# Patient Record
Sex: Male | Born: 1990 | Hispanic: Yes | Marital: Single | Smoking: Never smoker
Health system: Southern US, Community
[De-identification: ages and names within clinical notes are randomized; demographics above are authoritative.]

## PROBLEM LIST (undated history)

## (undated) HISTORY — PX: TUMOR REMOVAL: SHX12

---
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
## 2010-04-28 ENCOUNTER — Emergency Department: Payer: Self-pay | Admitting: Emergency Medicine

## 2012-12-15 ENCOUNTER — Emergency Department: Payer: Self-pay | Admitting: Emergency Medicine

## 2019-03-04 ENCOUNTER — Emergency Department: Payer: BLUE CROSS/BLUE SHIELD

## 2019-03-04 ENCOUNTER — Other Ambulatory Visit: Payer: Self-pay

## 2019-03-04 ENCOUNTER — Emergency Department
Admission: EM | Admit: 2019-03-04 | Discharge: 2019-03-04 | Disposition: A | Discharge status: Home / Self Care | Payer: BLUE CROSS/BLUE SHIELD | Attending: Emergency Medicine | Admitting: Emergency Medicine

## 2019-03-04 DIAGNOSIS — D352 Benign neoplasm of pituitary gland: Secondary | ICD-10-CM | POA: Insufficient documentation

## 2019-03-04 DIAGNOSIS — R42 Dizziness and giddiness: Secondary | ICD-10-CM | POA: Insufficient documentation

## 2019-03-04 DIAGNOSIS — H538 Other visual disturbances: Secondary | ICD-10-CM | POA: Insufficient documentation

## 2019-03-04 DIAGNOSIS — R51 Headache: Secondary | ICD-10-CM | POA: Diagnosis present

## 2019-03-04 DIAGNOSIS — R252 Cramp and spasm: Secondary | ICD-10-CM | POA: Insufficient documentation

## 2019-03-04 LAB — CBC WITH DIFFERENTIAL/PLATELET
Abs Immature Granulocytes: 0.13 10*3/uL — ABNORMAL HIGH (ref 0.00–0.07)
Basophils Absolute: 0.1 10*3/uL (ref 0.0–0.1)
Basophils Relative: 1 %
Eosinophils Absolute: 0.2 10*3/uL (ref 0.0–0.5)
Eosinophils Relative: 2 %
HCT: 40 % (ref 39.0–52.0)
Hemoglobin: 13.7 g/dL (ref 13.0–17.0)
Immature Granulocytes: 2 %
Lymphocytes Relative: 38 %
Lymphs Abs: 3 10*3/uL (ref 0.7–4.0)
MCH: 30.2 pg (ref 26.0–34.0)
MCHC: 34.3 g/dL (ref 30.0–36.0)
MCV: 88.3 fL (ref 80.0–100.0)
Monocytes Absolute: 0.5 10*3/uL (ref 0.1–1.0)
Monocytes Relative: 6 %
Neutro Abs: 4.2 10*3/uL (ref 1.7–7.7)
Neutrophils Relative %: 51 %
Platelets: 304 10*3/uL (ref 150–400)
RBC: 4.53 MIL/uL (ref 4.22–5.81)
RDW: 13.5 % (ref 11.5–15.5)
WBC: 8 10*3/uL (ref 4.0–10.5)
nRBC: 0 % (ref 0.0–0.2)

## 2019-03-04 LAB — TSH: TSH: 0.849 u[IU]/mL (ref 0.350–4.500)

## 2019-03-04 LAB — PROTIME-INR
INR: 0.8 (ref 0.8–1.2)
Prothrombin Time: 11.1 seconds — ABNORMAL LOW (ref 11.4–15.2)

## 2019-03-04 LAB — BASIC METABOLIC PANEL
Anion gap: 8 (ref 5–15)
BUN: 18 mg/dL (ref 6–20)
CO2: 22 mmol/L (ref 22–32)
Calcium: 9 mg/dL (ref 8.9–10.3)
Chloride: 100 mmol/L (ref 98–111)
Creatinine, Ser: 1.13 mg/dL (ref 0.61–1.24)
GFR calc Af Amer: >60 mL/min (ref >60)
GFR calc non Af Amer: >60 mL/min (ref >60)
Glucose, Bld: 97 mg/dL (ref 70–99)
Potassium: 5.4 mmol/L — ABNORMAL HIGH (ref 3.5–5.1)
Sodium: 130 mmol/L — ABNORMAL LOW (ref 135–145)

## 2019-03-04 LAB — TROPONIN I: Troponin I: <0.03 ng/mL (ref <0.03)

## 2019-03-04 LAB — T4, FREE: Free T4: 0.72 ng/dL — ABNORMAL LOW (ref 0.82–1.77)

## 2019-03-04 MED ORDER — ONDANSETRON 4 MG PO TBDP: 4.0000 mg | ORAL_TABLET | Freq: Three times a day (TID) | ORAL | 0 refills | Status: DC | PRN

## 2019-03-04 MED ORDER — GADOBUTROL 1 MMOL/ML IV SOLN
10.0000 mL | Freq: Once | INTRAVENOUS | Status: AC | PRN
  Administered 2019-03-04: 12:00:00 10 mL via INTRAVENOUS

## 2019-03-04 MED ORDER — IOHEXOL 350 MG/ML SOLN
75.0000 mL | Freq: Once | INTRAVENOUS | Status: AC | PRN
  Administered 2019-03-04: 75 mL via INTRAVENOUS

## 2019-03-04 NOTE — ED Notes (Signed)
Pt sent from [facility] for sudden onset headache about 630am directly after vomiting.  He says  That initially it was throbbing--frontal and top of head and more severe.  He says it is 4/10 now--improved from start.  He says he did have high bp one time when he ate too much pork, but never diagnosed with that.  He is sitting on side of stretcher in nad.

## 2019-03-04 NOTE — ED Notes (Signed)
Patient transported to MRI 

## 2019-03-04 NOTE — ED Notes (Signed)
Blue top tube draw from existing IV

## 2019-03-04 NOTE — Consult Note (Signed)
Neurosurgery-New Consultation Evaluation 03/04/2019 WILBERN PENNYPACKER [id number]  Identifying Statement: Brian Larson is a 28 y.o. male from [place] [address] with headache  Physician Requesting Consultation: [facility]  History of Present Illness: Brian Larson presents with a headache today that has resolved but given the severity, a MRI was obtained that demonstrated a sellar lesion. He states he has had headaches like this before including a couple of months ago. He denies any changes in his vision but does comment on some blurry vision on the left. He denies any weight changes, heat/cold intolerance, sexual dysfunction, changes in body size. He is otherwise healthy.   Past Medical History:  History reviewed. No pertinent past medical history.  Social History: Social History   Socioeconomic History  . Marital status: Single    Spouse name: Not on file  . Number of children: Not on file  . Years of education: Not on file  . Highest education level: Not on file  Occupational History  . Not on file  Social Needs  . Financial resource strain: Not on file  . Food insecurity:    Worry: Not on file    Inability: Not on file  . Transportation needs:    Medical: Not on file    Non-medical: Not on file  Tobacco Use  . Smoking status: Never Smoker  . Smokeless tobacco: Never Used  Substance and Sexual Activity  . Alcohol use: Not Currently  . Drug use: Not on file  . Sexual activity: Not on file  Lifestyle  . Physical activity:    Days per week: Not on file    Minutes per session: Not on file  . Stress: Not on file  Relationships  . Social connections:    Talks on phone: Not on file    Gets together: Not on file    Attends religious service: Not on file    Active member of club or organization: Not on file    Attends meetings of clubs or organizations: Not on file    Relationship status: Not on file  . Intimate partner violence:    Fear of current or ex partner: Not on  file    Emotionally abused: Not on file    Physically abused: Not on file    Forced sexual activity: Not on file  Other Topics Concern  . Not on file  Social History Narrative  . Not on file    Family History: No family history on file.  Review of Systems:  Review of Systems - General ROS: Negative Psychological ROS: Negative Ophthalmic ROS: Negative for vision change ENT ROS: Negative Hematological and Lymphatic ROS: Negative  Endocrine ROS: Negative Respiratory ROS: Negative Cardiovascular ROS: Negative Gastrointestinal ROS: Negative Genito-Urinary ROS: Negative Musculoskeletal ROS: Negative Neurological ROS: Positive for headache Dermatological ROS: Negative  Physical Exam: BP 130/82 (BP Location: Right Arm)   Pulse 70   Temp 98 F (36.7 C) (Oral)   Resp 16   Ht 5\' 6"  (1.676 m)   Wt 100.2 kg   SpO2 99%   BMI 35.67 kg/m  Body mass index is 35.67 kg/m. Body surface area is 2.16 meters squared. General appearance: Alert, cooperative, in no acute distress Head: Normocephalic, atraumatic Eyes: Normal, EOM intact Oropharynx: Wearing a mask Ext: No edema in LE bilaterally  Neurologic exam:  Mental status: alertness: alert, orientation: person, place, time, affect: normal Speech: fluent and clear Cranial nerves:  II: Visual fields are full by confrontation, no ptosis III/IV/VI:  extra-ocular motions intact bilaterally V/VII:no evidence of facial droop or weakness VIII: hearing normal XI: trapezius strength symmetric,  sternocleidomastoid strength symmetric XII: tongue strength symmetric  Motor:strength symmetric 5/5, normal muscle mass and tone in all extremities and no pronator drift Sensory: intact to light touch in all extremities Gait: not tested  Laboratory: Results for orders placed or performed during the hospital encounter of 03/04/19  CBC with Differential  Result Value Ref Range   WBC 8.0 4.0 - 10.5 K/uL   RBC 4.53 4.22 - 5.81 MIL/uL   Hemoglobin  13.7 13.0 - 17.0 g/dL   HCT 40.0 39.0 - 52.0 %   MCV 88.3 80.0 - 100.0 fL   MCH 30.2 26.0 - 34.0 pg   MCHC 34.3 30.0 - 36.0 g/dL   RDW 13.5 11.5 - 15.5 %   Platelets 304 150 - 400 K/uL   nRBC 0.0 0.0 - 0.2 %   Neutrophils Relative % 51 %   Neutro Abs 4.2 1.7 - 7.7 K/uL   Lymphocytes Relative 38 %   Lymphs Abs 3.0 0.7 - 4.0 K/uL   Monocytes Relative 6 %   Monocytes Absolute 0.5 0.1 - 1.0 K/uL   Eosinophils Relative 2 %   Eosinophils Absolute 0.2 0.0 - 0.5 K/uL   Basophils Relative 1 %   Basophils Absolute 0.1 0.0 - 0.1 K/uL   Immature Granulocytes 2 %   Abs Immature Granulocytes 0.13 (H) 0.00 - 0.07 K/uL  Basic metabolic panel  Result Value Ref Range   Sodium 130 (L) 135 - 145 mmol/L   Potassium 5.4 (H) 3.5 - 5.1 mmol/L   Chloride 100 98 - 111 mmol/L   CO2 22 22 - 32 mmol/L   Glucose, Bld 97 70 - 99 mg/dL   BUN 18 6 - 20 mg/dL   Creatinine, Ser 1.13 0.61 - 1.24 mg/dL   Calcium 9.0 8.9 - 10.3 mg/dL   GFR calc non Af Amer >60 >60 mL/min   GFR calc Af Amer >60 >60 mL/min   Anion gap 8 5 - 15  Troponin I - ONCE - STAT  Result Value Ref Range   Troponin I <0.03 <0.03 ng/mL  Protime-INR  Result Value Ref Range   Prothrombin Time 11.1 (L) 11.4 - 15.2 seconds   INR 0.8 0.8 - 1.2  TSH  Result Value Ref Range   TSH 0.849 0.350 - 4.500 uIU/mL  T4, free  Result Value Ref Range   Free T4 0.72 (L) 0.82 - 1.77 ng/dL   I personally reviewed labs  Imaging: Results for orders placed during the hospital encounter of 03/04/19  MR BRAIN W WO CONTRAST   Narrative CLINICAL DATA:  28 year old male with acute severe headache and possible enlargement/hyperdensity of the pituitary gland on plain head CT today.  EXAM: MRI HEAD WITHOUT AND WITH CONTRAST  TECHNIQUE: Multiplanar, multiecho pulse sequences of the brain and surrounding structures were obtained without and with intravenous contrast.  CONTRAST:  10 milliliters Gadavist  COMPARISON:  Head CT without contrast 1003  hours.  FINDINGS: Brain: Pituitary findings are below.  Superimposed normal cerebral volume. No restricted diffusion to suggest acute infarction. No midline shift, ventriculomegaly, extra-axial collection or acute intracranial hemorrhage. Cervicomedullary junction within normal limits. Pearline Cables and white matter signal is within normal limits throughout the brain. No cortical encephalomalacia or chronic cerebral blood products identified. Excluding the pituitary region, no abnormal intracranial enhancement. No dural thickening.  Vascular: Major intracranial vascular flow voids are preserved.  Skull and upper  cervical spine: Skull bone marrow signal is normal. The clivus is intact. There is mildly heterogeneous bone marrow signal in the C4 vertebral body which is probably benign (series 5, image 13). Otherwise negative visible cervical spine.  Sinuses/Orbits: Negative orbits. Paranasal sinuses are clear. Mastoid air cells are clear. Visible internal auditory structures appear normal. Scalp and face soft tissues appear negative.  Other: Dedicated pituitary imaging without and with contrast. The pituitary gland appears mildly enlarged although the suprasellar cistern remains normal. Thin slice pre and postcontrast images of the gland demonstrate an oval mildly lobulated space-occupying mass in the posterior aspect of the gland and sella encompassing 9 x 7 x 12-13 millimeters (AP by transverse by CC) this has intermediate T1 signal and does not enhance on dynamic postcontrast images. This area has dark T2 signal (series 8, image 9), dark FLAIR signal, and also appears decreased on T2 * (series 9, image 9).  This lesion appears to be displacing the more homogeneously enhancing gland anterior (series 20, image 7) along with anterior displacement of the infundibulum. The infundibulum has normal size and enhancement. The hypothalamus appears normal. The cavernous sinuses appear  normal.  IMPRESSION: 1. Positive for a 9 x 12 mm posteriorly situated intra-sellar circumscribed space-occupying mass with internal signal characteristics suspicious for hemorrhage. 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 There is associated anterior mass effect on the gland and infundibulum. The cavernous sinus and suprasellar cistern remain normal. Recommend endocrine function testing and referral to Endocrinology and/or Neurosurgery for further evaluation and potential treatment planning. 2. Otherwise normal MRI appearance of the brain.   Electronically Signed   By: Genevie Ann M.D.   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 We have also performed a CTA to rule out any aneurysm. He has no symptoms concerning for hormone dysfunction but I do recommend checking prolactin, TSH/T4, IGF-1. He will need endocrine follow up to complete work-up. He will also need full visual field testing with ophthalmology. I will set follow up with me in clinic to discuss further. There is no indication for urgent intervention given small nature of mass and no visual field symptoms.    1.  Diagnosis: Pituitary mass  2.  Plan - Follow up in clinic Referral to endocrinology and ophthalmology

## 2019-03-04 NOTE — ED Notes (Signed)
Patient transported to CT 

## 2019-03-04 NOTE — ED Notes (Signed)
Awaiting MRI

## 2019-03-04 NOTE — ED Notes (Signed)
Returned from ct 

## 2019-03-04 NOTE — ED Notes (Signed)
Vital signs stable. 

## 2019-03-04 NOTE — ED Notes (Signed)
Orthostatics  Lying hr 76 bp 133/99 sititng  Hr 71  bp 137/111 Standing hr 82 bp 127/111

## 2019-03-04 NOTE — ED Triage Notes (Signed)
Pt sent from [facility] with c/o HA N/V with dizziness that started this morning. States he felt fine yesterday.

## 2019-03-04 NOTE — ED Provider Notes (Signed)
[facility] Emergency Department Provider Note  ____________________________________________   First MD Initiated Contact with Patient 03/04/19 [phone]     (approximate)  I have reviewed the triage vital signs and the nursing notes.   HISTORY  Chief Complaint Emesis and Headache    HPI Brian Larson is a 28 y.o. male with headache and dizziness.  The patient states that his symptoms started around 6:30 AM this morning.  He states that he went to work at 6 AM was feeling fine.  He does admit he is been under significantly increased stress and was working physically all day yesterday with little to eat or drink.  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  He began to stand up and began to feel lightheaded and dizzy.  He then experienced acute onset of a mild and gradually progressively worse headache.  The headache is now improved.  He had associated cramping sensation in his hands and a sensation that he was going to pass out.  He felt like his vision was "blurry."  Denies any focal numbness or weakness.  No history of previous frequent headaches.  No recent head trauma.  He now feels tired but otherwise is without complaints.  Has had no fevers or chills.  No recent sick contacts.  Symptoms seem to be worse when he stands up.  No alleviating factors.  He went to [facility] urgent care prior to arrival was sent here for further evaluation.  No family history of subarachnoid's or aneurysms.          History reviewed. No pertinent past medical history.  There are no active problems to display for this patient.   History reviewed. No pertinent surgical history.  Prior to Admission medications   Medication Sig Start Date End Date Taking? Authorizing Provider  ondansetron (ZOFRAN ODT) 4 MG disintegrating tablet Take 1 tablet (4 mg total) by mouth every 8 (eight) hours as needed for nausea or vomiting. 03/04/19   Duffy Bruce, MD    Allergies Patient has  no known allergies.  No family history on file.  Social History Social History   Tobacco Use   Smoking status: Never Smoker   Smokeless tobacco: Never Used  Substance Use Topics   Alcohol use: Not Currently   Drug use: Not on file    Review of Systems Constitutional: No fever/chills Eyes: Slight subjective blurred vision as above, resolved.   ENT: No sore throat. Cardiovascular: Denies chest pain. Respiratory: Denies shortness of breath. Gastrointestinal: No abdominal pain.  Mild nausea, no vomiting.   No diarrhea.  No constipation. Genitourinary: Negative for dysuria. Musculoskeletal: Negative for neck pain.  Negative for back pain. Integumentary: Negative for rash. Neurological: Mild headache, dizziness as mentioned now resolved.  No tinnitus.  No focal numbness or weakness.  No tremors.    ____________________________________________   PHYSICAL EXAM:  VITAL SIGNS: ED Triage Vitals  Enc Vitals Group     BP 03/04/19 0912 (!) 135/96     Pulse Rate 03/04/19 0912 73     Resp 03/04/19 0912 17     Temp 03/04/19 0912 98.1 F (36.7 C)     Temp Source 03/04/19 0912 Oral     SpO2 03/04/19 0912 98 %     Weight 03/04/19 0913 221 lb (100.2 kg)     Height 03/04/19 0913 5\' 6"  (1.676 m)     Head Circumference --      Peak Flow --  Pain Score 03/04/19 0913 4     Pain Loc --      Pain Edu? --      Excl. in [place]? --    Constitutional: Alert and oriented. Well appearing and in no acute distress. Eyes: Conjunctivae are normal. PERRL. EOMI. Head: Atraumatic. Nose: No congestion/rhinnorhea. Mouth/Throat: Dry MM. OP clear. Neck: No stridor.  No meningeal signs.   Cardiovascular: Normal rate, regular rhythm. Good peripheral circulation. Grossly normal heart sounds. Respiratory: Normal respiratory effort.  No retractions. No audible wheezing. Gastrointestinal: Soft and nontender. No distention.  Neurologic:   Neurological Exam:  Mental Status: Alert and oriented to person,  place, and time. Attention and concentration normal. Speech clear. Recent memory is intact. Cranial Nerves: Visual fields grossly intact. EOMI and PERRLA. No nystagmus noted. Facial sensation intact at forehead, maxillary cheek, and chin/mandible bilaterally. No facial asymmetry or weakness. Hearing grossly normal. Uvula is midline, and palate elevates symmetrically. Normal SCM and trapezius strength. Tongue midline without fasciculations. Motor: Muscle strength 5/5 in proximal and distal UE and LE bilaterally. No pronator drift. Muscle tone normal. Reflexes: 2+ and symmetrical in all four extremities.  Sensation: Intact to light touch in upper and lower extremities distally bilaterally.  Gait: Normal without ataxia. Coordination: Normal FTN bilaterally. Skin:  Skin is warm, dry and intact. No rash noted.   ____________________________________________   LABS (all labs ordered are listed, but only abnormal results are displayed)  Labs Reviewed  CBC WITH DIFFERENTIAL/PLATELET - Abnormal; Notable for the following components:      Result Value   Abs Immature Granulocytes 0.13 (*)    All other components within normal limits  BASIC METABOLIC PANEL - Abnormal; Notable for the following components:   Sodium 130 (*)    Potassium 5.4 (*)    All other components within normal limits  PROTIME-INR - Abnormal; Notable for the following components:   Prothrombin Time 11.1 (*)    All other components within normal limits  T4, FREE - Abnormal; Notable for the following components:   Free T4 0.72 (*)    All other components within normal limits  TROPONIN I  TSH  PROLACTIN  INSULIN-LIKE GROWTH FACTOR   ____________________________________________  EKG  Normal sinus rhythm, ventricular rate 63.  PR 175, QRS 95, QTc 406.  Normal sinus rhythm.  No apparent acute ischemic changes. ____________________________________________  RADIOLOGY   ED MD interpretation: CT Head: Question of hyperdense  pituitary lesion. MRI as per below, concerning for likely pituitary adenoma vs cyst. CT Angio: No apparent large aneurysm noted on my review, no active bleeding.  Official radiology report(s): Ct Angio Head W Or Wo Contrast  Result Date: 03/04/2019 CLINICAL DATA:  Global headaches since 6 a.m. with dizziness. EXAM: CT ANGIOGRAPHY HEAD TECHNIQUE: Multidetector CT imaging of the head was performed using the standard protocol during bolus administration of intravenous contrast. Multiplanar CT image reconstructions and MIPs were obtained to evaluate the vascular anatomy. CONTRAST:  39mL OMNIPAQUE IOHEXOL 350 MG/ML SOLN COMPARISON:  None. FINDINGS: CTA HEAD Anterior circulation: No significant stenosis, proximal occlusion, aneurysm, or vascular malformation. Posterior circulation: No significant stenosis, proximal occlusion, aneurysm, or vascular malformation. Venous sinuses: As permitted by contrast timing, patent. Anatomic variants: None of significance Delayed phase: Not performed. IMPRESSION: Normal intracranial arterial vascular anatomy. There is no evidence for saccular aneurysm. No arterial feeder or extravasation is associated with the intra sellar hyperintense lesion. Differential considerations include pituitary apoplexy versus Rathke's cleft cyst. Neurosurgical consultation is warranted. Electronically Signed  By: Staci Righter M.D.   On: 03/04/2019 13:47   Ct Head Wo Contrast  Result Date: 03/04/2019 CLINICAL DATA:  Headache.  Worst headache of life. EXAM: CT HEAD WITHOUT CONTRAST TECHNIQUE: Contiguous axial images were obtained from the base of the skull through the vertex without intravenous contrast. COMPARISON:  None FINDINGS: Brain: There is increased density within the prominent pituitary gland which measures 83.6 Hounsfield units compared with 45 HU of normal brain parenchyma. The gland itself has a height of 8 mm which is within normal limits. The cerebral and cerebellar hemispheres appear  unremarkable. No evidence for acute infarct, hydrocephalus, or cerebral or cerebellar hemorrhage. Vascular: No hyperdense vessel or unexpected calcification. Skull: Normal. Negative for fracture or focal lesion. Sinuses/Orbits: None Other: None IMPRESSION: 1. There is increased density within the normal size pituitary gland measuring 83.6 HU, greater than normal brain parenchyma. Etiology indeterminate. In the acute setting, a small hemorrhagic pituitary adenoma cannot be excluded. Recommend further evaluation with contrast enhanced pituitary gland MRI. 2. Remainder of the gland is negative. Electronically Signed   By: Kerby Moors M.D.   On: 03/04/2019 10:20   Mr Jeri Cos DX Contrast  Result Date: 03/04/2019 CLINICAL DATA:  28 year old male with acute severe headache and possible enlargement/hyperdensity of the pituitary gland on plain head CT today. EXAM: MRI HEAD WITHOUT AND WITH CONTRAST TECHNIQUE: Multiplanar, multiecho pulse sequences of the brain and surrounding structures were obtained without and with intravenous contrast. CONTRAST:  10 milliliters Gadavist COMPARISON:  Head CT without contrast 1003 hours. FINDINGS: Brain: Pituitary findings are below. Superimposed normal cerebral volume. No restricted diffusion to suggest acute infarction. No midline shift, ventriculomegaly, extra-axial collection or acute intracranial hemorrhage. Cervicomedullary junction within normal limits. Pearline Cables and white matter signal is within normal limits throughout the brain. No cortical encephalomalacia or chronic cerebral blood products identified. Excluding the pituitary region, no abnormal intracranial enhancement. No dural thickening. Vascular: Major intracranial vascular flow voids are preserved. Skull and upper cervical spine: Skull bone marrow signal is normal. The clivus is intact. There is mildly heterogeneous bone marrow signal in the C4 vertebral body which is probably benign (series 5, image 13). Otherwise  negative visible cervical spine. Sinuses/Orbits: Negative orbits. Paranasal sinuses are clear. Mastoid air cells are clear. Visible internal auditory structures appear normal. Scalp and face soft tissues appear negative. Other: Dedicated pituitary imaging without and with contrast. The pituitary gland appears mildly enlarged although the suprasellar cistern remains normal. Thin slice pre and postcontrast images of the gland demonstrate an oval mildly lobulated space-occupying mass in the posterior aspect of the gland and sella encompassing 9 x 7 x 12-13 millimeters (AP by transverse by CC) this has intermediate T1 signal and does not enhance on dynamic postcontrast images. This area has dark T2 signal (series 8, image 9), dark FLAIR signal, and also appears decreased on T2 * (series 9, image 9). This lesion appears to be displacing the more homogeneously enhancing gland anterior (series 20, image 7) along with anterior displacement of the infundibulum. The infundibulum has normal size and enhancement. The hypothalamus appears normal. The cavernous sinuses appear normal. IMPRESSION: 1. Positive for a 9 x 12 mm posteriorly situated intra-sellar circumscribed space-occupying mass with internal signal characteristics suspicious for hemorrhage. 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 There is associated anterior mass effect on the gland and infundibulum. The cavernous sinus and suprasellar cistern remain normal. Recommend endocrine function testing and referral to Endocrinology and/or Neurosurgery for further evaluation and  potential treatment planning. 2. Otherwise normal MRI appearance of the brain. Electronically Signed   By: Genevie Ann M.D.   On: 03/04/2019 12:36    ____________________________________________   PROCEDURES   Procedure(s) performed (including Critical Care):  Procedures   ____________________________________________   INITIAL IMPRESSION / MDM / [place] / ED  COURSE  As part of my medical decision making, I reviewed the following data within the [place] notes reviewed and incorporated, Notes from prior ED visits and [place] Controlled Substance [employer] was evaluated in Emergency Department on 03/04/2019 for the symptoms described in the history of present illness. He was evaluated in the context of the global COVID-19 pandemic, which necessitated consideration that the patient might be at risk for infection with the SARS-CoV-2 virus that causes COVID-19. Institutional protocols and algorithms that pertain to the evaluation of patients at risk for COVID-19 are in a state of rapid change based on information released by regulatory bodies including the CDC and federal and state organizations. These policies and algorithms were followed during the patient's care in the ED.  Some ED evaluations and interventions may be delayed as a result of limited staffing during the pandemic.*   Clinical Course as of Mar 03 1837  Tue Mar 04, 2019  0947 28 yo M here with mild HA, dizziness, weakness. Sx sound most consistent with transient vagal response versus orthostasis. However, HA is new and not usual so will check CT Head. He is <6 hours prior to onset and I have a low pre-test probability of SAH. No focal neuro deficits. He is mildly hypertensive here with could also be contributing. Will f/u labs, orthostatics, and re-assess.    [CI]  1001 Orthostatics wnl. Mild HTN actually noted upon standing.   [CI]  0981 CT scan reviewed by myself, c/f pituitary adenoma. I subsequently obtain MRI and see a pituitary adenoma with small microhemorrhage.  Will consult neurosurgery.   [CI]    Clinical Course User Index [CI] Duffy Bruce, MD    Case as above. 28 yo M here with HA. Imaging concerning for pituitary adenoma with microhemorrhage, not actively bleeding. Dr. Lacinda Axon has seen at bedside. Will send endocrine labs and refer for  close otupt follow-up. Neuro intact. HA resolved. VSS.   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   Duffy Bruce, MD 03/04/19 [phone]

## 2019-03-04 NOTE — ED Notes (Signed)
MD at bedside. 

## 2019-03-05 LAB — INSULIN-LIKE GROWTH FACTOR: Somatomedin C: 142 ng/mL (ref 98–282)

## 2019-03-05 LAB — PROLACTIN: Prolactin: 10.5 ng/mL (ref 4.0–15.2)

## 2019-03-13 ENCOUNTER — Other Ambulatory Visit: Payer: Self-pay | Admitting: Neurosurgery

## 2019-03-13 DIAGNOSIS — D352 Benign neoplasm of pituitary gland: Secondary | ICD-10-CM

## 2019-03-13 DIAGNOSIS — R519 Headache, unspecified: Secondary | ICD-10-CM

## 2019-03-14 ENCOUNTER — Ambulatory Visit: Payer: BC Managed Care – PPO | Attending: Neurosurgery

## 2020-02-22 IMAGING — CT CT ANGIOGRAPHY HEAD
3 of 7 series · 16 of 47 positions shown · IV contrast (APPLIED)
Comparison: None.

CLINICAL DATA: Global headaches since 6 a.m. with dizziness.

EXAM:
CT ANGIOGRAPHY HEAD
TECHNIQUE: Multidetector CT imaging of the head was performed using the
standard protocol during bolus administration of intravenous
contrast. Multiplanar CT image reconstructions and MIPs were
obtained to evaluate the vascular anatomy.
CONTRAST:  75mL OMNIPAQUE IOHEXOL 350 MG/ML SOLN

[Series 7: ax thin · axial · 0.37mm/px · z∈[-187,-46]mm · 10 of 163 slices shown]
[im 10/163  brain]
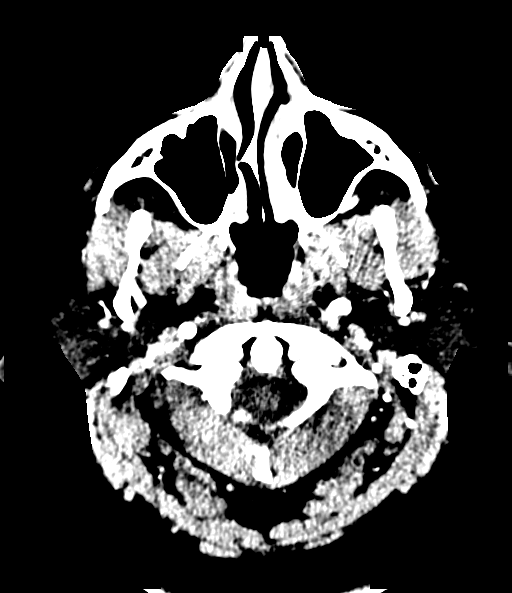
[im 29/163  bone]
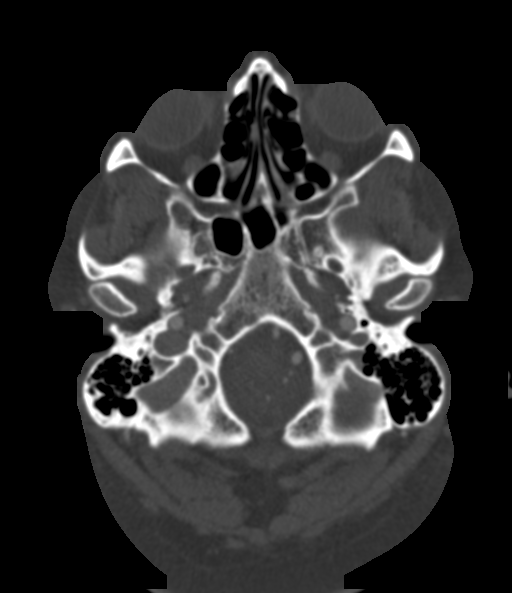
[im 48/163  brain]
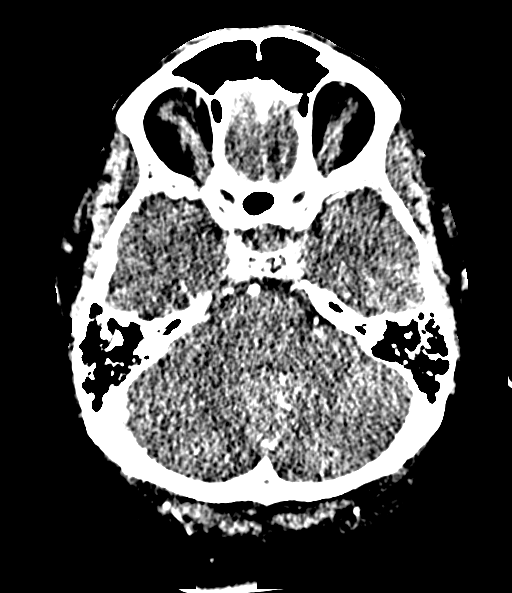
[im 58/163  bone]
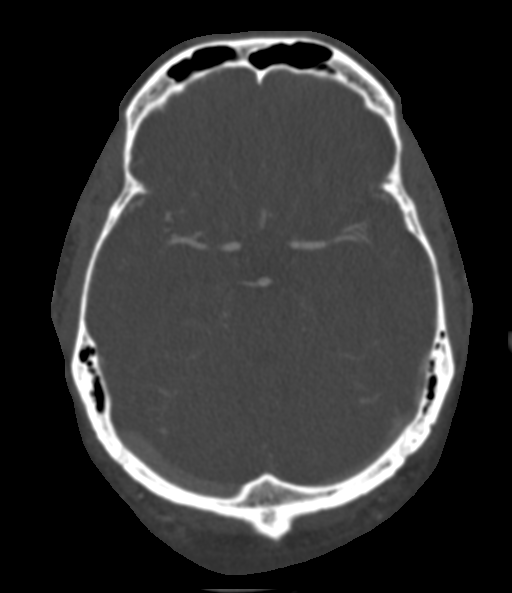
[im 77/163  brain]
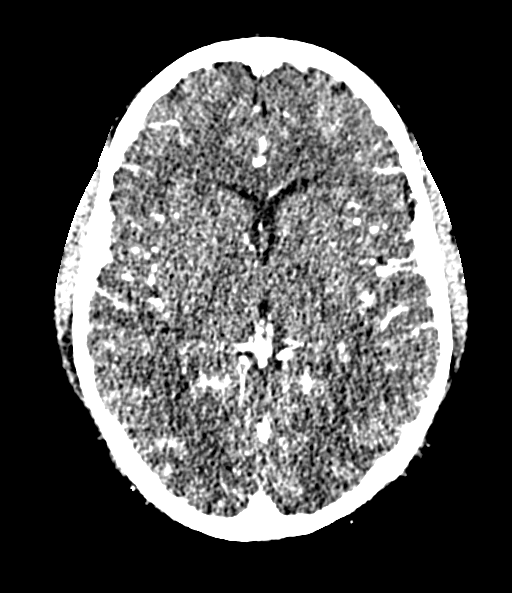
[im 86/163  bone]
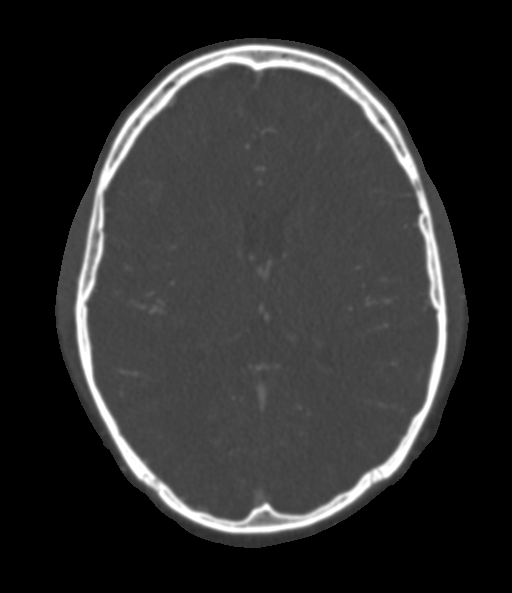
[im 105/163  brain]
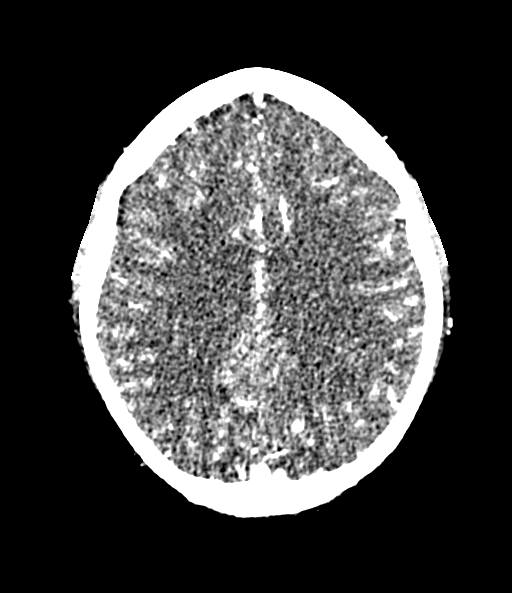
[im 124/163  bone]
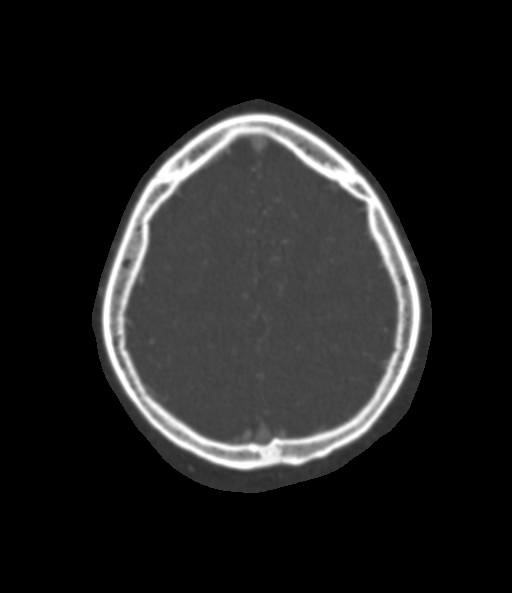
[im 134/163  brain]
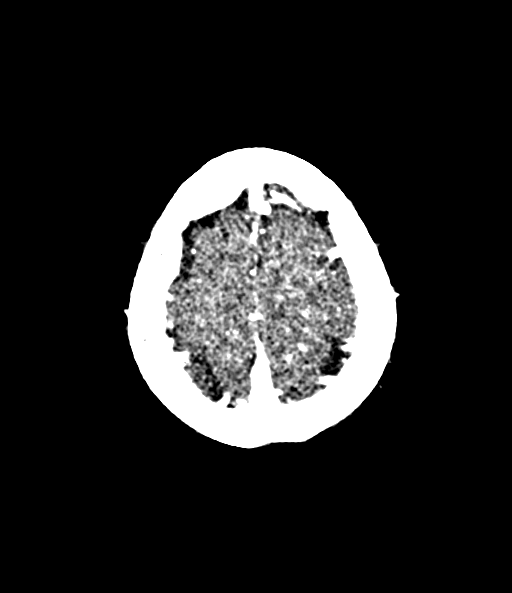
[im 153/163  bone]
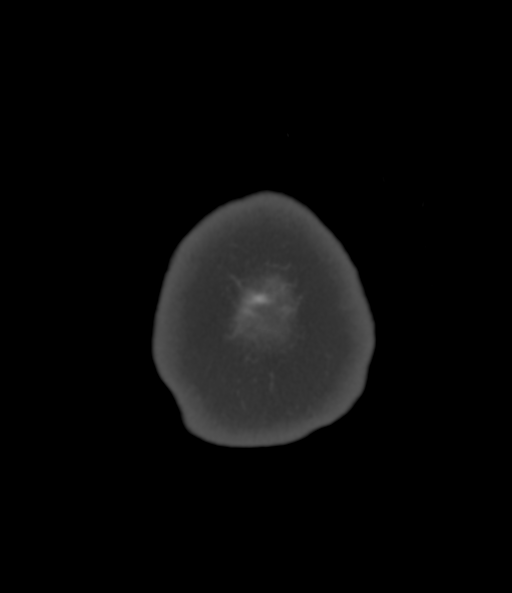

[Series 9: cor thin · coronal · 0.33mm/px · 3 of 219 slices shown]
[im 63/219  brain]
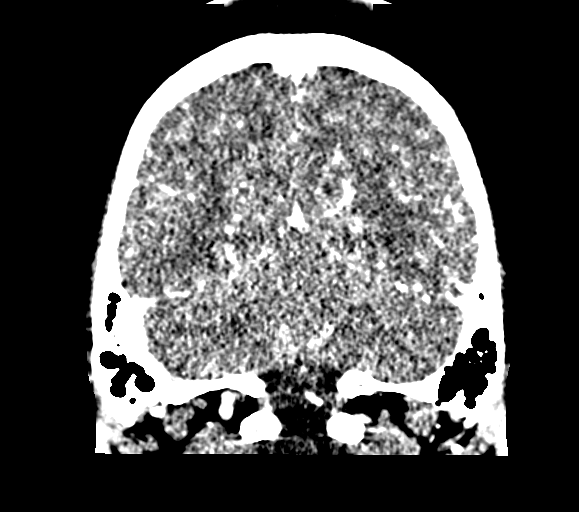
[im 94/219  brain]
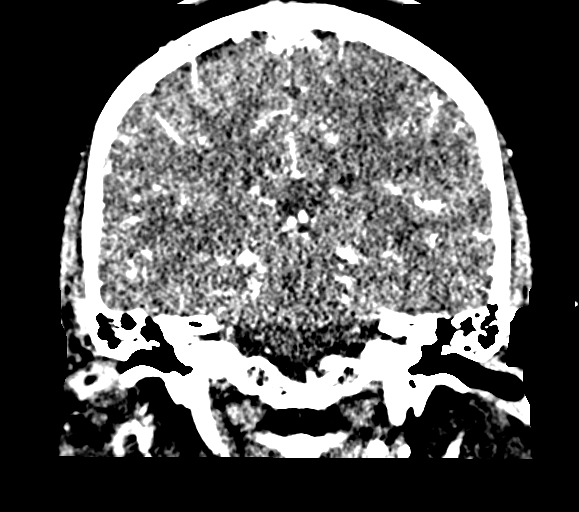
[im 125/219  brain]
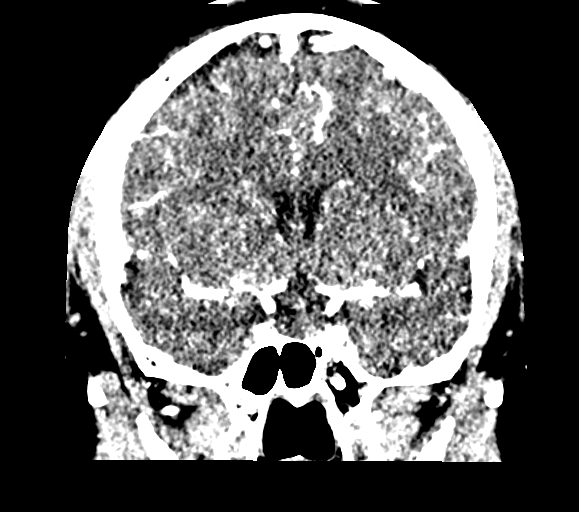

[Series 11: sag thin · sagittal · 0.33mm/px · 3 of 190 slices shown]
[im 38/190  brain]
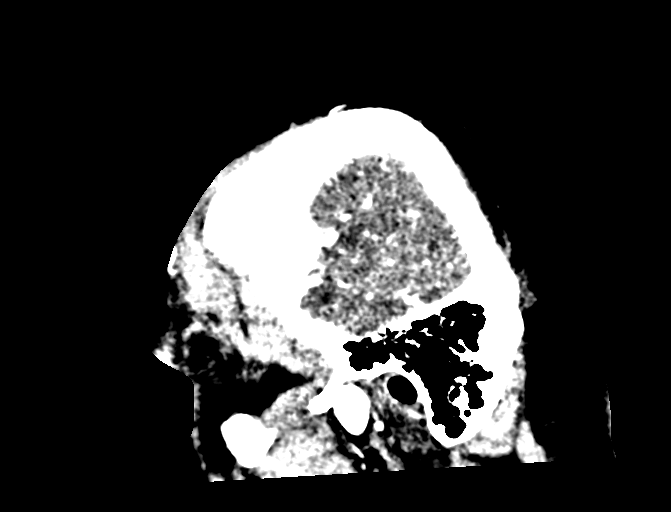
[im 76/190  brain]
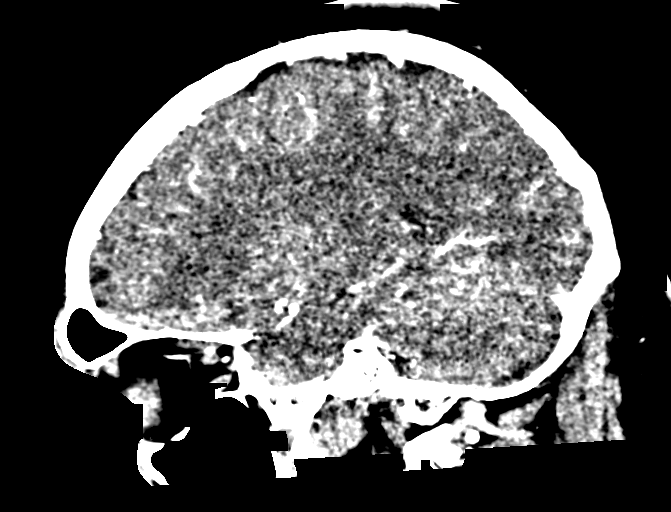
[im 114/190  brain]
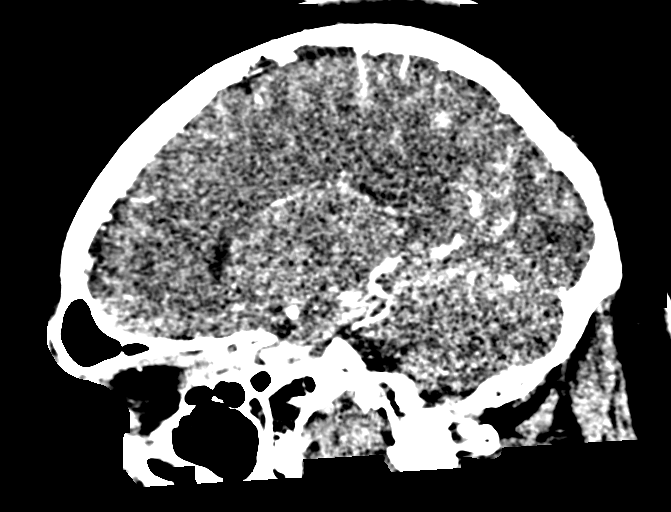

[16 of 47 positions shown; findings below may reference images not displayed]

FINDINGS: CTA HEAD

Anterior circulation: No significant stenosis, proximal occlusion,
aneurysm, or vascular malformation.

Posterior circulation: No significant stenosis, proximal occlusion,
aneurysm, or vascular malformation.

Venous sinuses: As permitted by contrast timing, patent.

Anatomic variants: None of significance

Delayed phase: Not performed.
IMPRESSION: Normal intracranial arterial vascular anatomy. There is no evidence
for saccular aneurysm. No arterial feeder or extravasation is
associated with the intra sellar hyperintense lesion.

Differential considerations include pituitary apoplexy versus
Rathke's cleft cyst. Neurosurgical consultation is warranted.

## 2020-05-03 ENCOUNTER — Emergency Department
Admission: EM | Admit: 2020-05-03 | Discharge: 2020-05-03 | Disposition: A | Discharge status: Home / Self Care | Payer: BC Managed Care – PPO | Attending: Emergency Medicine | Admitting: Emergency Medicine

## 2020-05-03 ENCOUNTER — Encounter: Payer: Self-pay | Admitting: *Deleted

## 2020-05-03 ENCOUNTER — Other Ambulatory Visit: Payer: Self-pay

## 2020-05-03 DIAGNOSIS — R110 Nausea: Secondary | ICD-10-CM | POA: Insufficient documentation

## 2020-05-03 DIAGNOSIS — Z5321 Procedure and treatment not carried out due to patient leaving prior to being seen by health care provider: Secondary | ICD-10-CM | POA: Insufficient documentation

## 2020-05-03 DIAGNOSIS — R519 Headache, unspecified: Secondary | ICD-10-CM | POA: Insufficient documentation

## 2020-05-03 NOTE — ED Notes (Signed)
No ct scan  At this time per dr Corky Downs

## 2020-05-03 NOTE — ED Triage Notes (Signed)
Pt ambulatory to triage.  Pt has a headache .  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   Pt has nausea.  No dizziness.  Pt alert  Speech clear.

## 2024-04-23 ENCOUNTER — Ambulatory Visit (INDEPENDENT_AMBULATORY_CARE_PROVIDER_SITE_OTHER): Payer: Worker's Compensation

## 2024-04-23 ENCOUNTER — Ambulatory Visit
Admission: EM | Admit: 2024-04-23 | Discharge: 2024-04-23 | Disposition: A | Discharge status: Home / Self Care | Payer: Worker's Compensation | Source: Ambulatory Visit | Attending: Family Medicine | Admitting: Family Medicine

## 2024-04-23 DIAGNOSIS — R030 Elevated blood-pressure reading, without diagnosis of hypertension: Secondary | ICD-10-CM

## 2024-04-23 DIAGNOSIS — M25512 Pain in left shoulder: Secondary | ICD-10-CM

## 2024-04-23 DIAGNOSIS — Z758 Other problems related to medical facilities and other health care: Secondary | ICD-10-CM

## 2024-04-23 MED ORDER — METHOCARBAMOL 500 MG PO TABS: 500.0000 mg | ORAL_TABLET | Freq: Three times a day (TID) | ORAL | 0 refills | Status: AC | PRN

## 2024-04-23 MED ORDER — PREDNISONE 10 MG (21) PO TBPK: ORAL_TABLET | Freq: Every day | ORAL | 0 refills | Status: AC

## 2024-04-23 NOTE — Discharge Instructions (Addendum)
 Be sure to schedule an appointment with a primary care provider. Get a blood pressure monitor and keep a record of your blood pressures.    If medication was prescribed, stop by the pharmacy to pick up your prescriptions.  For your  pain, Take prednisone  as prescribed, 1500 mg Tylenol twice a day, take muscle relaxer as needed for pain. Rest and elevate the affected painful area.  Apply cold compresses intermittently, as needed.  As pain recedes, begin normal activities slowly as tolerated.  Follow up with primary care provider or an orthopedic provider, if symptoms persist.  Watch for worsening symptoms such as an increasing weakness or loss of sensation, increasing pain and/or the loss of bladder or bowel function. Should any of these occur, go to the emergency department immediately.   Follow up with Katherine McManama, NP [address] [place], [place] [address] Call to make appointment at [phone] Clinic hours: 8a-5p M-F. Arrive before 4 PM

## 2024-04-23 NOTE — ED Provider Notes (Signed)
 [facility]    CSN: [id number] Arrival date & time: 04/23/24  1819      History   Chief Complaint Chief Complaint  Patient presents with   Shoulder Injury    HPI  HPI Brian Larson is a 33 y.o. male.   Brian Larson presents for left shoulder pain after reaching for boxes last night. 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 Around 1030 PM, he reached for a box on the 5th level and feeling a sharp jolt of pain that gradually got worse. Feels like a needle sticking in his shoulder. Denies hearing abnormal pop or sounds. He spoke with nurse and he was asked to do a workers comp claim.  Took a tablet of Aleve or Advil around 12 PM.  He is right handed. Denies history of shoulder pain.       No past medical history on file.  There are no active problems to display for this patient.   Past Surgical History:  Procedure Laterality Date   TUMOR REMOVAL         Home Medications    Prior to Admission medications   Medication Sig Start Date End Date Taking? Authorizing Provider  ondansetron  (ZOFRAN  ODT) 4 MG disintegrating tablet Take 1 tablet (4 mg total) by mouth every 8 (eight) hours as needed for nausea or vomiting. 03/04/19   Angelena Smalls, MD    Family History No family history on file.  Social History Social History   Tobacco Use   Smoking status: Never   Smokeless tobacco: Never  Vaping Use   Vaping status: Never Used  Substance Use Topics   Alcohol use: Not Currently     Allergies   Patient has no known allergies.   Review of Systems Review of Systems: :negative unless otherwise stated in HPI.      Physical Exam Triage Vital Signs ED Triage Vitals  Encounter Vitals Group     BP 04/23/24 1837 (!) 170/109     Girls Systolic BP Percentile --      Girls Diastolic BP Percentile --      Boys Systolic BP Percentile --      Boys Diastolic BP Percentile --      Pulse Rate 04/23/24 1837 64     Resp 04/23/24 1837 17     Temp 04/23/24  1837 98.3 F (36.8 C)     Temp Source 04/23/24 1837 Oral     SpO2 04/23/24 1837 95 %     Weight --      Height --      Head Circumference --      Peak Flow --      Pain Score 04/23/24 1836 7     Pain Loc --      Pain Education --      Exclude from Growth Chart --    No data found.  Updated Vital Signs BP (!) 170/109 (BP Location: Right Arm) Comment: no hx of hypertension  Pulse 64   Temp 98.3 F (36.8 C) (Oral)   Resp 17   SpO2 95%   Visual Acuity Right Eye Distance:   Left Eye Distance:   Bilateral Distance:    Right Eye Near:   Left Eye Near:    Bilateral Near:     Physical Exam GEN: well appearing male in no acute distress  CVS: well perfused  RESP: speaking in full sentences without pause, no respiratory distress  MSK:   LEft shoulder:  No evidence of bony deformity, asymmetry, or muscle atrophy. No tenderness over long head of biceps (bicipital groove).  +TTP at [facility] joint.  Full  passive (ABD, ADD, Flexion, extension, IR, ER). Limited active 2/2 to pain.  Strength 5/5 grip, elbow and shoulder. No abnormal scapular function observed.  Special Tests: Hawkins: positive; Empty Can: negative, Neer's: Negative; Painful arc: Negative; Anterior Apprehension: Negative Sensation intact. Peripheral pulses intact.   UC Treatments / Results  Labs (all labs ordered are listed, but only abnormal results are displayed) Labs Reviewed - No data to display  EKG   Radiology No results found.   Procedures Procedures (including critical care time)  Medications Ordered in UC Medications - No data to display  Initial Impression / Assessment and Plan / UC Course  I have reviewed the triage vital signs and the nursing notes.  Pertinent labs & imaging results that were available during my care of the patient were reviewed by me and considered in my medical decision making (see chart for details).      Pt is a 33 y.o.  male with 1 day of left shoulder pain after  reaching at work.     Brian Larson is hypertensive here.  BP 170/109 then after sitting was 159/108. Previous blood pressures from chart review were *** elevated. I suspect high blood pressure issue is secondary to pain but may have undiagnosed hypertension. ***No history of high blood pressure. Says he gets seen regularly and no one has told him his blood pressure. Neurosurgery and ENT notes for the last 2 years reviewed but I do not see a blood pressure recorded. He previously was going to [facility] but that was a while ago.  Recommended he check hisblood pressure and follow up with a new primary care provider.  Left shoulder pain: DDX rotator cuff tear, biceps tendinopathy, AC joint arthritis/bursitis, shoulder impingement, glenohumeral arthritis, cervical radiculopathy  On exam, pt has tenderness at [facility] joint.    Obtained left shoulder plain films.  Personally interpreted by me were unremarkable for fracture or dislocation. Radiologist report reviewed and additionally notes  no soft tissue swelling.  Given left shoulder sling for comfort.   Patient to gradually return to normal activities, as tolerated and continue ordinary activities within the limits permitted by pain. Prescribed Naproxen sodium *** and muscle relaxer *** for pain relief.  Tylenol PRN. Advised patient to avoid OTC NSAIDs while taking prescription NSAID. Counseled patient on red flag symptoms and when to seek immediate care.  ***No red flags such as progressive major motor weakness.   Patient to follow up with orthopedic provider, if symptoms do not improve with conservative treatment.  Return and ED precautions given. Understanding voiced. Discussed MDM, treatment plan and plan for follow-up with patient who agrees with plan.   Final Clinical Impressions(s) / UC Diagnoses   Final diagnoses:  None   Discharge Instructions   None    ED Prescriptions   None    PDMP not reviewed this encounter.

## 2024-04-23 NOTE — ED Triage Notes (Addendum)
 Injury happened last night  Patient was reaching for boxes above hus head and felt a pinch in his left shoulder. States that pain has gotten worse.   Advil at 12:00 today
